# Patient Record
Sex: Male | Born: 1955 | Hispanic: No | Marital: Married | Smoking: Former smoker
Health system: Southern US, Community
[De-identification: ages and names within clinical notes are randomized; demographics above are authoritative.]

## PROBLEM LIST (undated history)

## (undated) DIAGNOSIS — S129XXA Fracture of neck, unspecified, initial encounter: Secondary | ICD-10-CM

## (undated) DIAGNOSIS — I10 Essential (primary) hypertension: Secondary | ICD-10-CM

## (undated) DIAGNOSIS — R42 Dizziness and giddiness: Secondary | ICD-10-CM

## (undated) HISTORY — PX: CERVICAL SPINE SURGERY: SHX589

---

## 2003-06-06 ENCOUNTER — Encounter: Admission: RE | Admit: 2003-06-06 | Discharge: 2003-06-06 | Payer: Self-pay | Admitting: Family Medicine

## 2003-06-21 ENCOUNTER — Encounter: Admission: RE | Admit: 2003-06-21 | Discharge: 2003-07-20 | Payer: Self-pay | Admitting: Family Medicine

## 2007-11-19 ENCOUNTER — Ambulatory Visit: Payer: Self-pay | Admitting: *Deleted

## 2007-11-19 DIAGNOSIS — R209 Unspecified disturbances of skin sensation: Secondary | ICD-10-CM | POA: Insufficient documentation

## 2007-11-19 DIAGNOSIS — F1910 Other psychoactive substance abuse, uncomplicated: Secondary | ICD-10-CM

## 2007-11-19 DIAGNOSIS — R002 Palpitations: Secondary | ICD-10-CM | POA: Insufficient documentation

## 2007-11-20 DIAGNOSIS — E7800 Pure hypercholesterolemia, unspecified: Secondary | ICD-10-CM

## 2007-12-09 ENCOUNTER — Encounter: Payer: Self-pay | Admitting: Gastroenterology

## 2007-12-11 ENCOUNTER — Ambulatory Visit: Payer: Self-pay | Admitting: Gastroenterology

## 2010-06-03 LAB — CONVERTED CEMR LAB
Albumin: 4.4 g/dL (ref 3.5–5.2)
BUN: 10 mg/dL (ref 6–23)
Basophils Relative: 0.3 % (ref 0.0–3.0)
Calcium: 9.6 mg/dL (ref 8.4–10.5)
Chloride: 100 meq/L (ref 96–112)
Creatinine, Ser: 1 mg/dL (ref 0.4–1.5)
Direct LDL: 124.7 mg/dL
Eosinophils Relative: 1.8 % (ref 0.0–5.0)
GFR calc non Af Amer: 84 mL/min
Glucose, Bld: 95 mg/dL (ref 70–99)
HCT: 46.6 % (ref 39.0–52.0)
MCV: 87.4 fL (ref 78.0–100.0)
Monocytes Absolute: 0.4 10*3/uL (ref 0.1–1.0)
Monocytes Relative: 6.3 % (ref 3.0–12.0)
Neutrophils Relative %: 61.1 % (ref 43.0–77.0)
PSA: 0.51 ng/mL (ref 0.10–4.00)
Platelets: 217 10*3/uL (ref 150–400)
Potassium: 3.9 meq/L (ref 3.5–5.1)
RBC: 5.33 M/uL (ref 4.22–5.81)
Total CHOL/HDL Ratio: 6.1
Triglycerides: 203 mg/dL (ref 0–149)
WBC: 6.6 10*3/uL (ref 4.5–10.5)

## 2011-09-06 ENCOUNTER — Emergency Department (INDEPENDENT_AMBULATORY_CARE_PROVIDER_SITE_OTHER): Payer: Managed Care, Other (non HMO)

## 2011-09-06 ENCOUNTER — Emergency Department (HOSPITAL_BASED_OUTPATIENT_CLINIC_OR_DEPARTMENT_OTHER)
Admission: EM | Admit: 2011-09-06 | Discharge: 2011-09-06 | Disposition: A | Discharge status: Home / Self Care | Payer: Managed Care, Other (non HMO) | Attending: Emergency Medicine | Admitting: Emergency Medicine

## 2011-09-06 ENCOUNTER — Encounter (HOSPITAL_BASED_OUTPATIENT_CLINIC_OR_DEPARTMENT_OTHER): Payer: Self-pay | Admitting: *Deleted

## 2011-09-06 DIAGNOSIS — R42 Dizziness and giddiness: Secondary | ICD-10-CM

## 2011-09-06 DIAGNOSIS — R51 Headache: Secondary | ICD-10-CM

## 2011-09-06 DIAGNOSIS — R110 Nausea: Secondary | ICD-10-CM

## 2011-09-06 HISTORY — DX: Fracture of neck, unspecified, initial encounter: S12.9XXA

## 2011-09-06 HISTORY — DX: Dizziness and giddiness: R42

## 2011-09-06 LAB — BASIC METABOLIC PANEL WITH GFR
BUN: 16 mg/dL (ref 6–23)
CO2: 27 meq/L (ref 19–32)
Chloride: 101 meq/L (ref 96–112)
Creatinine, Ser: 0.8 mg/dL (ref 0.50–1.35)
Potassium: 3.5 meq/L (ref 3.5–5.1)

## 2011-09-06 LAB — DIFFERENTIAL
Basophils Absolute: 0 10*3/uL (ref 0.0–0.1)
Basophils Relative: 0 % (ref 0–1)
Eosinophils Absolute: 0.2 10*3/uL (ref 0.0–0.7)
Eosinophils Relative: 2 % (ref 0–5)
Lymphocytes Relative: 28 % (ref 12–46)
Lymphs Abs: 2.4 K/uL (ref 0.7–4.0)
Monocytes Absolute: 0.7 K/uL (ref 0.1–1.0)
Monocytes Relative: 8 % (ref 3–12)
Neutro Abs: 5.4 K/uL (ref 1.7–7.7)
Neutrophils Relative %: 62 % (ref 43–77)

## 2011-09-06 LAB — CBC
HCT: 41.3 % (ref 39.0–52.0)
Hemoglobin: 14.8 g/dL (ref 13.0–17.0)
MCH: 30.3 pg (ref 26.0–34.0)
MCHC: 35.8 g/dL (ref 30.0–36.0)
MCV: 84.5 fL (ref 78.0–100.0)
Platelets: 215 10*3/uL (ref 150–400)
RBC: 4.89 MIL/uL (ref 4.22–5.81)
RDW: 12.8 % (ref 11.5–15.5)
WBC: 8.7 K/uL (ref 4.0–10.5)

## 2011-09-06 LAB — BASIC METABOLIC PANEL
Calcium: 9.9 mg/dL (ref 8.4–10.5)
GFR calc Af Amer: >90 mL/min (ref >90)
GFR calc non Af Amer: >90 mL/min (ref >90)
Glucose, Bld: 118 mg/dL — ABNORMAL HIGH (ref 70–99)
Sodium: 139 mEq/L (ref 135–145)

## 2011-09-06 MED ORDER — IOHEXOL 350 MG/ML SOLN
80.0000 mL | Freq: Once | INTRAVENOUS | Status: AC | PRN
  Administered 2011-09-06: 80 mL via INTRAVENOUS

## 2011-09-06 MED ORDER — DIPHENHYDRAMINE HCL 50 MG/ML IJ SOLN
12.5000 mg | Freq: Once | INTRAMUSCULAR | Status: AC
  Administered 2011-09-06: 12.5 mg via INTRAVENOUS
  Filled 2011-09-06: qty 1

## 2011-09-06 MED ORDER — METOCLOPRAMIDE HCL 5 MG/ML IJ SOLN
10.0000 mg | Freq: Once | INTRAMUSCULAR | Status: AC
  Administered 2011-09-06: 10 mg via INTRAVENOUS
  Filled 2011-09-06: qty 2

## 2011-09-06 MED ORDER — MECLIZINE HCL 25 MG PO TABS: 25.0000 mg | ORAL_TABLET | Freq: Three times a day (TID) | ORAL | Status: AC | PRN

## 2011-09-06 NOTE — ED Notes (Signed)
Pt was driving his car approx 30min ago, when he had a sudden onset of dizzines, so severe that he had to pull over to the side of the road. Pt then drove to [facility], checked his BP and it was 176/104. Pt also reports nausea and a headache. Pt denies CP/SOB.

## 2011-09-06 NOTE — Discharge Instructions (Signed)
Vertigo Vertigo means you feel like you or your surroundings are moving when they are not. Vertigo can be dangerous if it occurs when you are at work, driving, or performing difficult activities.  CAUSES  Vertigo occurs when there is a conflict of signals sent to your brain from the visual and sensory systems in your body. There are many different causes of vertigo, including:  Infections, especially in the inner ear.   A bad reaction to a drug or misuse of alcohol and medicines.   Withdrawal from drugs or alcohol.   Rapidly changing positions, such as lying down or rolling over in bed.   A migraine headache.   Decreased blood flow to the brain.   Increased pressure in the brain from a head injury, infection, tumor, or bleeding.  SYMPTOMS  You may feel as though the world is spinning around or you are falling to the ground. Because your balance is upset, vertigo can cause nausea and vomiting. You may have involuntary eye movements (nystagmus). DIAGNOSIS  Vertigo is usually diagnosed by physical exam. If the cause of your vertigo is unknown, your caregiver may perform imaging tests, such as an MRI scan (magnetic resonance imaging). TREATMENT  Most cases of vertigo resolve on their own, without treatment. Depending on the cause, your caregiver may prescribe certain medicines. If your vertigo is related to body position issues, your caregiver may recommend movements or procedures to correct the problem. In rare cases, if your vertigo is caused by certain inner ear problems, you may need surgery. HOME CARE INSTRUCTIONS   Follow your caregiver's instructions.   Avoid driving.   Avoid operating heavy machinery.   Avoid performing any tasks that would be dangerous to you or others during a vertigo episode.   Tell your caregiver if you notice that certain medicines seem to be causing your vertigo. Some of the medicines used to treat vertigo episodes can actually make them worse in some  people.  SEEK IMMEDIATE MEDICAL CARE IF:   Your medicines do not relieve your vertigo or are making it worse.   You develop problems with talking, walking, weakness, or using your arms, hands, or legs.   You develop severe headaches.   Your nausea or vomiting continues or gets worse.   You develop visual changes.   A family member notices behavioral changes.   Your condition gets worse.  MAKE SURE YOU:  Understand these instructions.   Will watch your condition.   Will get help right away if you are not doing well or get worse.  Document Released: 01/30/2005 Document Revised: 04/11/2011 Document Reviewed: 11/08/2010 [facility] Patient Information 2012 [place], [place].    Headache Headaches are caused by many different problems. Most commonly, headache is caused by muscle tension from an injury, fatigue, or emotional upset. Excessive muscle contractions in the scalp and neck result in a headache that often feels like a tight band around the head. Tension headaches often have areas of tenderness over the scalp and the back of the neck. These headaches may last for hours, days, or longer, and some may contribute to migraines in those who have migraine problems. Migraines usually cause a throbbing headache, which is made worse by activity. Sometimes only one side of the head hurts. Nausea, vomiting, eye pain, and avoidance of food are common with migraines. Visual symptoms such as light sensitivity, blind spots, or flashing lights may also occur. Loud noises may worsen migraine headaches. Many factors may cause migraine headaches:  Emotional stress,  lack of sleep, and menstrual periods.   Alcohol and some drugs (such as birth control pills).   Diet factors (fasting, caffeine, food preservatives, chocolate).   Environmental factors (weather changes, bright lights, odors, smoke).  Other causes of headaches include minor injuries to the head. Arthritis in the neck; problems with the  jaw, eyes, ears, or nose are also causes of headaches. Allergies, drugs, alcohol, and exposure to smoke can also cause moderate headaches. Rebound headaches can occur if someone uses pain medications for a long period of time and then stops. Less commonly, blood vessel problems in the neck and brain (including stroke) can cause various types of headache. Treatment of headaches includes medicines for pain and relaxation. Ice packs or heat applied to the back of the head and neck help some people. Massaging the shoulders, neck and scalp are often very useful. Relaxation techniques and stretching can help prevent these headaches. Avoid alcohol and cigarette smoking as these tend to make headaches worse. Please see your caregiver if your headache is not better in 2 days.  SEEK IMMEDIATE MEDICAL CARE IF:   You develop a high fever, chills, or repeated vomiting.   You faint or have difficulty with vision.   You develop unusual numbness or weakness of your arms or legs.   Relief of pain is inadequate with medication, or you develop severe pain.   You develop confusion, or neck stiffness.   You have a worsening of a headache or do not obtain relief.  Document Released: 04/22/2005 Document Revised: 04/11/2011 Document Reviewed: 10/16/2006 [facility] Patient Information 2012 [place], [place].

## 2011-09-06 NOTE — ED Provider Notes (Signed)
History     CSN: [id number]  Arrival date & time 09/06/11  2028   First MD Initiated Contact with Patient 09/06/11 2135      Chief Complaint  Patient presents with  . Dizziness    (Consider location/radiation/quality/duration/timing/severity/associated sxs/prior treatment) HPI Comments: Pt was in usual state of health, was driving home around 1:19 PM and he twisted his neck to "crack it" and had a very loud crack, denies any immediate pain, but did develop a diffuse HA and had a sensation of the car spinning on the road and felt dizzy.  He managed to pull off in a nearby parking lot of a pharmacy, was able to ambulate with loss of balance, staggering and went inside, took BP and it was 170/90.  Normally doesn't have HTN.  denies focal numbness or weakness or slurred speech or visual change.  He continues to have some HA.  He has had vertigo in the past, but somewhat different.  No recent cough or cold.  No recent head trauma.  He has prior h/o cervical neck fracture requiring surgery and wearing a Halo for 3 months.    The history is provided by the patient.    Past Medical History  Diagnosis Date  . Vertigo   . Cervical spine fracture     Past Surgical History  Procedure Date  . Cervical spine surgery     History reviewed. No pertinent family history.  History  Substance Use Topics  . Smoking status: Former Games developer  . Smokeless tobacco: Not on file  . Alcohol Use: Yes     occasionally      Review of Systems  Constitutional: Negative.   Eyes: Negative for visual disturbance.  Cardiovascular: Negative for chest pain.  Gastrointestinal: Negative for nausea and vomiting.  Neurological: Positive for dizziness and headaches. Negative for weakness, light-headedness and numbness.  All other systems reviewed and are negative.    Allergies  Review of patient's allergies indicates no known allergies.  Home Medications   Current Outpatient Rx  Name Route Sig Dispense  Refill  . MECLIZINE HCL 25 MG PO TABS Oral Take 1 tablet (25 mg total) by mouth 3 (three) times daily as needed for dizziness or nausea. 15 tablet 0    BP 131/84  Pulse 80  Temp(Src) 98.5 F (36.9 C) (Oral)  Resp 18  Ht 5\' 9"  (1.753 m)  Wt 194 lb (87.998 kg)  BMI 28.65 kg/m2  SpO2 99%  Physical Exam  Nursing note and vitals reviewed. Constitutional: He is oriented to person, place, and time. He appears well-developed and well-nourished.  HENT:  Head: Normocephalic and atraumatic.  Cardiovascular: Normal rate.   Pulmonary/Chest: Effort normal and breath sounds normal.  Abdominal: Soft.  Neurological: He is alert and oriented to person, place, and time. He has normal strength. No cranial nerve deficit or sensory deficit. Coordination normal. GCS eye subscore is 4. GCS verbal subscore is 5. GCS motor subscore is 6.       Normal finger to nose and normal heel shin testing bilaterally.  No nystagmus  Skin: Skin is warm and dry.    ED Course  Procedures (including critical care time)  Labs Reviewed  BASIC METABOLIC PANEL - Abnormal; Notable for the following:    Glucose, Bld 118 (*)    All other components within normal limits  CBC  DIFFERENTIAL   Ct Angio Neck W/cm &/or Wo/cm  09/06/2011  *RADIOLOGY REPORT*  Clinical Data:  56 year old male with sudden  onset of dizziness nausea and headache after popping neck.  Remote history of cervical spine fracture.  CT ANGIOGRAPHY NECK  Technique:  Multidetector CT imaging of the neck was performed using the standard protocol during bolus administration of intravenous contrast.  Multiplanar CT image reconstructions including MIPs were obtained to evaluate the vascular anatomy. Carotid stenosis measurements (when applicable) are obtained utilizing NASCET criteria, using the distal internal carotid diameter as the denominator.  Contrast: 80mL OMNIPAQUE IOHEXOL 350 MG/ML SOLN  Comparison:   None.  Findings:  Negative lung apices.  No superior  mediastinal lymphadenopathy.  Negative thyroid, larynx, pharynx, parapharyngeal spaces, retropharyngeal space, sublingual space, submandibular glands and parotid glands. Visualized orbit soft tissues are within normal limits.  Cervical lymph nodes are within normal limits, no lymphadenopathy.  Negative visualized brain parenchyma. Visualized paranasal sinuses and mastoids are clear.  The skull base is intact.  Normal cervical vertebral height and alignment. Bilateral posterior element alignment is within normal limits.  Cervicothoracic junction alignment is within normal limits.  No clear healed deformity in the cervical spine. No acute osseous abnormality identified.  Vascular Findings: Aortic arch not included on these images. Normal proximal right brachial cephalic artery, left common carotid and left subclavian arteries.  Normal right common carotid artery origin.  Normal right common carotid artery and right carotid bifurcation.  Mildly tortuous but otherwise negative cervical right ICA.  Negative visualized right ICA siphon and terminus; right ACA A1 segment is hypoplastic / absent.  Dominant right vertebral artery.  Normal right vertebral artery origin.  The right vertebral artery is within normal limits throughout the neck and the skull base.  The right vertebral primarily supplies the basilar.  Right PICA is not identified the right AICA is patent.  Negative visualized basilar artery and other posterior circulation.  Normal left common carotid artery.  Normal left carotid bifurcation.  Mildly tortuous but otherwise negative cervical left ICA.  Mildly ectatic but otherwise negative visualized left ICA siphon diminutive left posterior communicating artery identified within normal origin.  The right ACA supplied from the left.  The left ICA terminus and visualized other anterior circulation are within normal limits.  Nondominant left vertebral artery.  Left vertebral artery origin and proximal course are  within normal limits.  Left vertebral artery is patent throughout the neck and within normal limits.  At the skull base, the left vertebral artery functionally terminates in PICA.   Review of the MIP images confirms the above findings.  IMPRESSION: 1.  Dominant right vertebral artery.  No vertebral artery dissection or stenosis. 2.  Negative visualized carotid arteries aside from tortuosity. 3.  No acute findings in the neck.  Original Report Authenticated By: Harley Hallmark, M.D.     1. Vertigo   2. Headache      ECG at time 20:44 shows NSR, normal axis, normal intervals, no ST or T wave abn's.   RA sat is 99% and normal.   11:35 PM Pt feels improved, HA is improved, BP is improved.  Pt is reassured that CT is ok.  Will give antivert as prescription and pt instructed to follow up with PCP next week.   MDM  Due to onset of vertigo with possible neck trauma, some concern for dissection of carotid or vertebral artery.  I discussed with Dr. Margo Aye with radiology who recommended CTA of neck.  Pt given IV reglan and benadryl for HA.  Will continue to monitor.          Casimiro Needle  Gareth Morgan, MD 09/06/11 [phone]

## 2012-09-07 IMAGING — CT CT ANGIO NECK
2 of 7 series · 8 of 33 positions shown · IV contrast (omnipaque)
Comparison: None.

CLINICAL DATA: 56-year-old male with sudden onset of dizziness
nausea and headache after popping neck.  Remote history of cervical
spine fracture.

CT ANGIOGRAPHY NECK
TECHNIQUE: Multidetector CT imaging of the neck was performed
using the standard protocol during bolus administration of
intravenous contrast.  Multiplanar CT image reconstructions
including MIPs were obtained to evaluate the vascular anatomy.
Carotid stenosis measurements (when applicable) are obtained
utilizing NASCET criteria, using the distal internal carotid
diameter as the denominator.
Contrast: 80mL OMNIPAQUE IOHEXOL 350 MG/ML SOLN

[Series 5: angio 2.0 b25f · axial · 0.46mm/px · z∈[-140,-56]mm · 2 of 126 slices shown]
[im 42/126  soft-tissue]
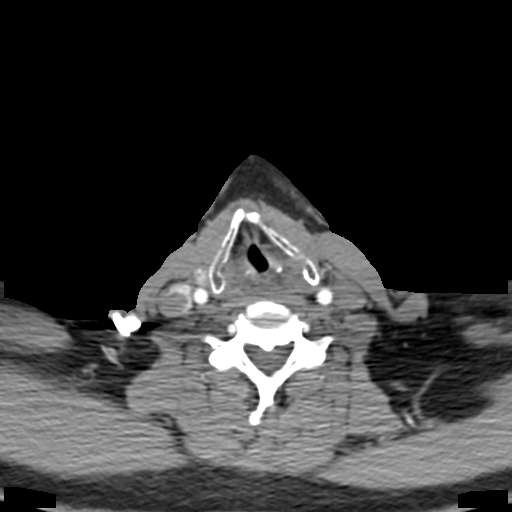
[im 84/126  soft-tissue]
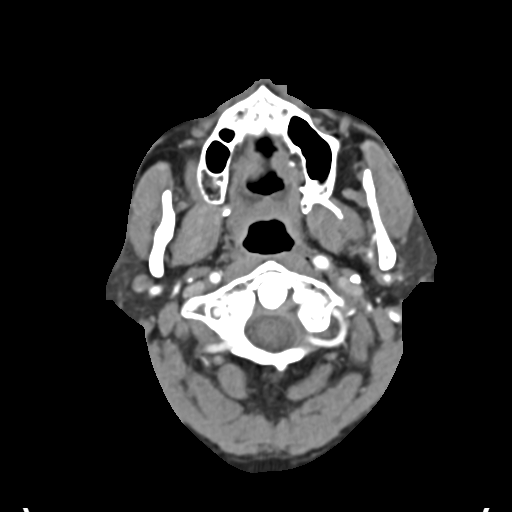

[Series 11: ax thin · axial · 0.43mm/px · z∈[-186,-10]mm · 6 of 248 slices shown]
[im 36/248  soft-tissue]
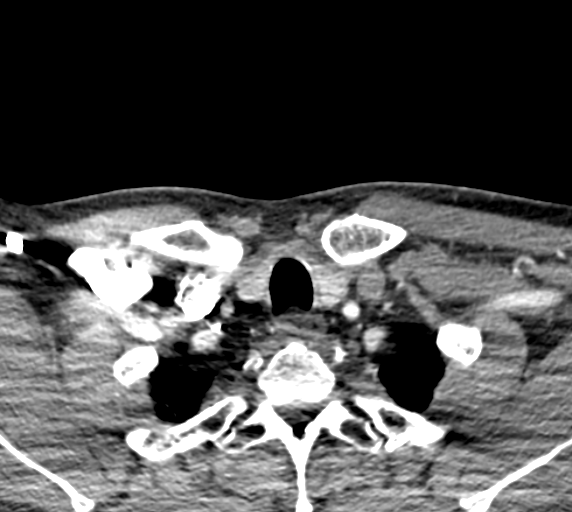
[im 71/248  bone]
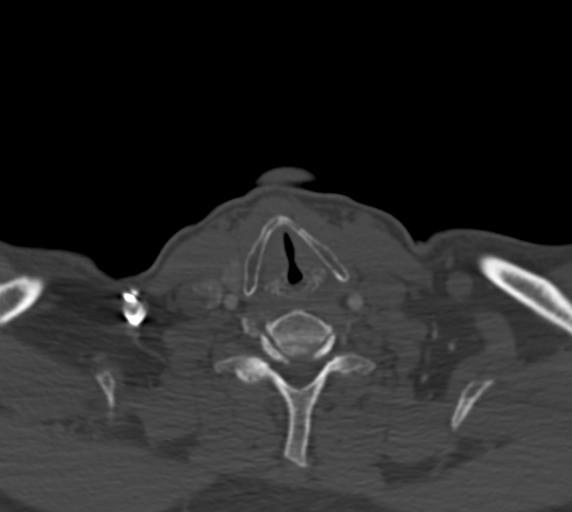
[im 106/248  soft-tissue]
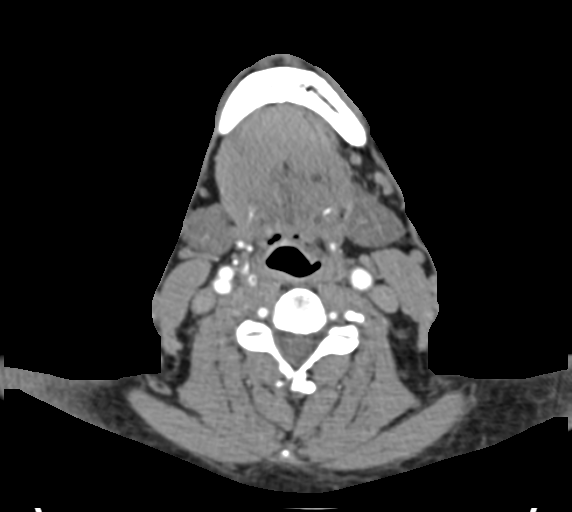
[im 142/248  bone]
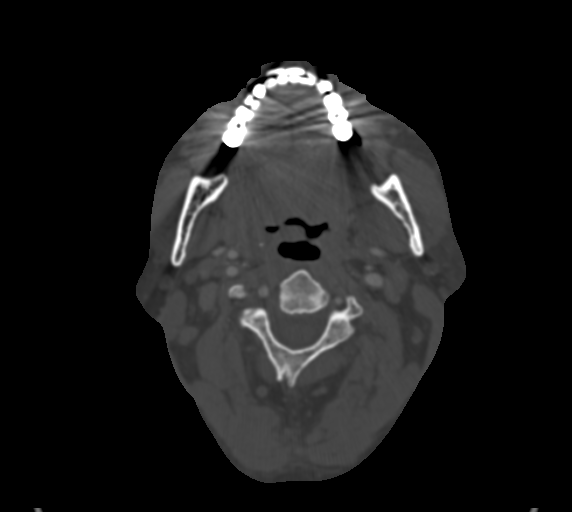
[im 177/248  soft-tissue]
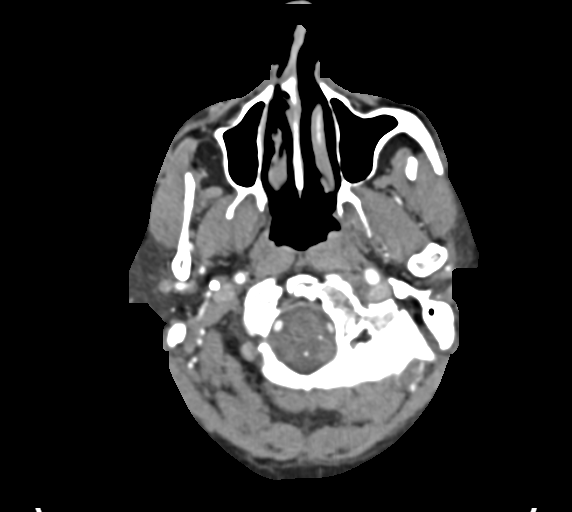
[im 212/248  bone]
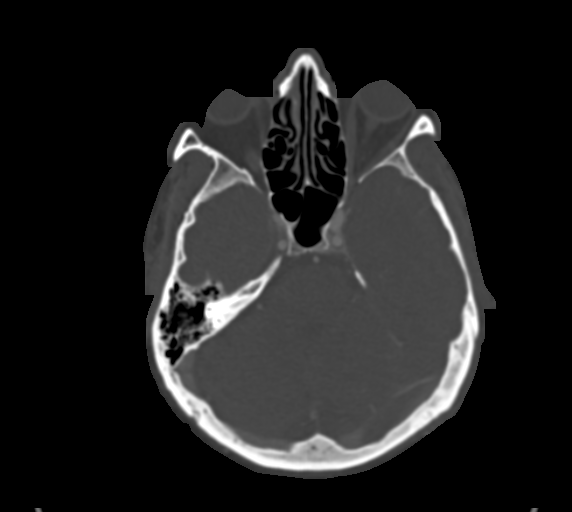

[8 of 33 positions shown; findings below may reference images not displayed]

FINDINGS: Negative lung apices.  No superior mediastinal
lymphadenopathy.  Negative thyroid, larynx, pharynx, parapharyngeal
spaces, retropharyngeal space, sublingual space, submandibular
glands and parotid glands. Visualized orbit soft tissues are within
normal limits.  Cervical lymph nodes are within normal limits, no
lymphadenopathy.

Negative visualized brain parenchyma. Visualized paranasal sinuses
and mastoids are clear.

The skull base is intact.  Normal cervical vertebral height and
alignment. Bilateral posterior element alignment is within normal
limits.  Cervicothoracic junction alignment is within normal
limits.  No clear healed deformity in the cervical spine. No acute
osseous abnormality identified.

Vascular Findings: Aortic arch not included on these images.
Normal proximal right brachial cephalic artery, left common carotid
and left subclavian arteries.

Normal right common carotid artery origin.  Normal right common
carotid artery and right carotid bifurcation.  Mildly tortuous but
otherwise negative cervical right ICA.  Negative visualized right
ICA siphon and terminus; right ACA A1 segment is hypoplastic /
absent.

Dominant right vertebral artery.  Normal right vertebral artery
origin.  The right vertebral artery is within normal limits
throughout the neck and the skull base.  The right vertebral
primarily supplies the basilar.  Right PICA is not identified the
right AICA is patent.  Negative visualized basilar artery and other
posterior circulation.

Normal left common carotid artery.  Normal left carotid
bifurcation.  Mildly tortuous but otherwise negative cervical left
ICA.  Mildly ectatic but otherwise negative visualized left ICA
siphon diminutive left posterior communicating artery identified
within normal origin.  The right ACA supplied from the left.  The
left ICA terminus and visualized other anterior circulation are
within normal limits.

Nondominant left vertebral artery.  Left vertebral artery origin
and proximal course are within normal limits.  Left vertebral
artery is patent throughout the neck and within normal limits.  At
the skull base, the left vertebral artery functionally terminates
in PICA.

 Review of the MIP images confirms the above findings.
IMPRESSION: 1.  Dominant right vertebral artery.  No vertebral artery
dissection or stenosis.
2.  Negative visualized carotid arteries aside from tortuosity.
3.  No acute findings in the neck.

## 2013-06-09 ENCOUNTER — Encounter (HOSPITAL_BASED_OUTPATIENT_CLINIC_OR_DEPARTMENT_OTHER): Payer: Self-pay | Admitting: Emergency Medicine

## 2013-06-09 ENCOUNTER — Emergency Department (HOSPITAL_BASED_OUTPATIENT_CLINIC_OR_DEPARTMENT_OTHER)
Admission: EM | Admit: 2013-06-09 | Discharge: 2013-06-09 | Disposition: A | Discharge status: Home / Self Care | Payer: PRIVATE HEALTH INSURANCE | Attending: Emergency Medicine | Admitting: Emergency Medicine

## 2013-06-09 DIAGNOSIS — R51 Headache: Secondary | ICD-10-CM | POA: Insufficient documentation

## 2013-06-09 DIAGNOSIS — I10 Essential (primary) hypertension: Secondary | ICD-10-CM | POA: Insufficient documentation

## 2013-06-09 DIAGNOSIS — Z8781 Personal history of (healed) traumatic fracture: Secondary | ICD-10-CM | POA: Insufficient documentation

## 2013-06-09 DIAGNOSIS — R519 Headache, unspecified: Secondary | ICD-10-CM

## 2013-06-09 LAB — BASIC METABOLIC PANEL
BUN: 16 mg/dL (ref 6–23)
CALCIUM: 9 mg/dL (ref 8.4–10.5)
CHLORIDE: 101 meq/L (ref 96–112)
CO2: 23 meq/L (ref 19–32)
CREATININE: 1 mg/dL (ref 0.50–1.35)
GFR calc Af Amer: >90 mL/min (ref >90)
GFR calc non Af Amer: 82 mL/min — ABNORMAL LOW (ref >90)
GLUCOSE: 101 mg/dL — AB (ref 70–99)
Potassium: 3.9 mEq/L (ref 3.7–5.3)
Sodium: 138 mEq/L (ref 137–147)

## 2013-06-09 MED ORDER — ONDANSETRON 4 MG PO TBDP
4.0000 mg | ORAL_TABLET | Freq: Once | ORAL | Status: AC
  Administered 2013-06-09: 4 mg via ORAL
  Filled 2013-06-09: qty 1

## 2013-06-09 MED ORDER — ACETAMINOPHEN 325 MG PO TABS
650.0000 mg | ORAL_TABLET | Freq: Once | ORAL | Status: AC
  Administered 2013-06-09: 650 mg via ORAL
  Filled 2013-06-09: qty 2

## 2013-06-09 NOTE — ED Notes (Signed)
C/o HA started approx 2 hours ago, dizziness-checked BP-was elevated

## 2013-06-09 NOTE — ED Provider Notes (Signed)
CSN: [id number]     Arrival date & time 06/09/13  1851 History  This chart was scribed [id number] Tracy B. Bernette MayersSheldon, MD by Danella Maiersaroline Early, ED Scribe. This patient was seen in room MH07/MH07 and the patient's care was started at 7:30 PM.    Chief Complaint  Patient presents with  . Headache   The history is provided by the patient. No language interpreter was used.   HPI Comments: Tracy Valencia is a 58 y.o. male who presents to the Emergency Department complaining of headache onset 5pm today with nausea and dizziness while shopping at the grocery store. He states the headache came first. He states [facility] and it was 155/107 He is not on blood pressure medication. He has a PCP but has never been told he has high BP. He has been [place] trip to [place], during which he had the flu. He got back yesterday. He denies CP, blurred or double vision, SOB, vomiting.   Past Medical History  Diagnosis Date  . Vertigo   . Cervical spine fracture    Past Surgical History  Procedure Laterality Date  . Cervical spine surgery     No family history on file. History  Substance Use Topics  . Smoking status: Former Games developermoker  . Smokeless tobacco: Not on file  . Alcohol Use: Yes     Comment: occasionally    Review of Systems  Eyes: Negative for visual disturbance.  Respiratory: Negative for shortness of breath.   Cardiovascular: Negative for chest pain.  Gastrointestinal: Positive for nausea. Negative for vomiting.  Neurological: Positive for dizziness and headaches.   A complete 10 system review of systems was obtained and all systems are negative except as noted in the HPI and PMH.   Allergies  Review of patient's allergies indicates no known allergies.  Home Medications  No current outpatient prescriptions on file. BP 160/101  Pulse 87  Temp(Src) 97.9 F (36.6 C) (Oral)  Resp 18  Ht 5\' 8"  (1.727 m)  Wt 205 lb (92.987 kg)  BMI 31.18 kg/m2  SpO2  100% Physical Exam  Nursing note and vitals reviewed. Constitutional: He is oriented to person, place, and time. He appears well-developed and well-nourished.  HENT:  Head: Normocephalic and atraumatic.  Eyes: EOM are normal. Pupils are equal, round, and reactive to light.  Neck: Normal range of motion. Neck supple.  Cardiovascular: Normal rate, normal heart sounds and intact distal pulses.   Pulmonary/Chest: Effort normal and breath sounds normal.  Abdominal: Bowel sounds are normal. He exhibits no distension. There is no tenderness.  Musculoskeletal: Normal range of motion. He exhibits no edema and no tenderness.  Neurological: He is alert and oriented to person, place, and time. He has normal strength. No cranial nerve deficit or sensory deficit.  Skin: Skin is warm and dry. No rash noted.  Psychiatric: He has a normal mood and affect.    ED Course  Procedures (including critical care time) Medications  acetaminophen (TYLENOL) tablet 650 mg (650 mg Oral Given 06/09/13 1959)  ondansetron (ZOFRAN-ODT) disintegrating tablet 4 mg (4 mg Oral Given 06/09/13 2000)    DIAGNOSTIC STUDIES: Oxygen Saturation is 100% on RA, normal by my interpretation.    COORDINATION OF CARE: 7:42 PM- Discussed treatment plan with pt which includes Zofran, Tylenol, blood work, and EKG. Pt agrees to plan.   Labs Review Labs Reviewed  BASIC METABOLIC PANEL - Abnormal; Notable for the following:  Glucose, Bld 101 (*)    GFR calc non Af Amer 82 (*)    All other components within normal limits   Imaging Review No results found.  EKG Interpretation    Date/Time:  Wednesday June 09 2013 20:10:18 EST Ventricular Rate:  78 PR Interval:  148 QRS Duration: 98 QT Interval:  402 QTC Calculation: 458 R Axis:   70 Text Interpretation:  Normal sinus rhythm Normal ECG No significant change since last tracing Confirmed by [name]  MD, Tracy ([id number]) on 06/09/2013 8:46:00 PM            MDM   1.  Headache   2. Hypertension     HTN with headache and nausea but no end organ damage. Symptoms and BP improved in the ED. Advised to log BP at home and followup with PCP.   I personally performed the services described in this documentation, which was scribed in my presence. The recorded information has been reviewed and is accurate.      Tracy B. Bernette Mayers, MD 06/09/13 2151

## 2013-06-09 NOTE — Discharge Instructions (Signed)

## 2013-06-09 NOTE — ED Notes (Signed)
MD at bedside. 

## 2019-08-09 ENCOUNTER — Ambulatory Visit: Payer: Self-pay | Attending: Internal Medicine

## 2022-06-08 ENCOUNTER — Encounter (HOSPITAL_BASED_OUTPATIENT_CLINIC_OR_DEPARTMENT_OTHER): Payer: Self-pay | Admitting: Emergency Medicine

## 2022-06-08 ENCOUNTER — Emergency Department (HOSPITAL_BASED_OUTPATIENT_CLINIC_OR_DEPARTMENT_OTHER)
Admission: EM | Admit: 2022-06-08 | Discharge: 2022-06-08 | Disposition: A | Discharge status: Home / Self Care | Payer: 59 | Attending: Emergency Medicine | Admitting: Emergency Medicine

## 2022-06-08 ENCOUNTER — Emergency Department (HOSPITAL_BASED_OUTPATIENT_CLINIC_OR_DEPARTMENT_OTHER): Payer: 59

## 2022-06-08 DIAGNOSIS — E876 Hypokalemia: Secondary | ICD-10-CM | POA: Insufficient documentation

## 2022-06-08 DIAGNOSIS — R519 Headache, unspecified: Secondary | ICD-10-CM | POA: Diagnosis present

## 2022-06-08 DIAGNOSIS — I10 Essential (primary) hypertension: Secondary | ICD-10-CM | POA: Insufficient documentation

## 2022-06-08 HISTORY — DX: Essential (primary) hypertension: I10

## 2022-06-08 LAB — CBC WITH DIFFERENTIAL/PLATELET
Abs Immature Granulocytes: 0.03 10*3/uL (ref 0.00–0.07)
Basophils Absolute: 0 10*3/uL (ref 0.0–0.1)
Basophils Relative: 0 %
Eosinophils Absolute: 0.1 10*3/uL (ref 0.0–0.5)
Eosinophils Relative: 1 %
HCT: 42.7 % (ref 39.0–52.0)
Hemoglobin: 14.8 g/dL (ref 13.0–17.0)
Immature Granulocytes: 0 %
Lymphocytes Relative: 23 %
Lymphs Abs: 2.2 10*3/uL (ref 0.7–4.0)
MCH: 30.3 pg (ref 26.0–34.0)
MCHC: 34.7 g/dL (ref 30.0–36.0)
MCV: 87.5 fL (ref 80.0–100.0)
Monocytes Absolute: 0.6 10*3/uL (ref 0.1–1.0)
Monocytes Relative: 7 %
Neutro Abs: 6.4 10*3/uL (ref 1.7–7.7)
Neutrophils Relative %: 69 %
Platelets: 210 10*3/uL (ref 150–400)
RBC: 4.88 MIL/uL (ref 4.22–5.81)
RDW: 11.9 % (ref 11.5–15.5)
WBC: 9.4 10*3/uL (ref 4.0–10.5)
nRBC: 0 % (ref 0.0–0.2)

## 2022-06-08 LAB — BASIC METABOLIC PANEL
Anion gap: 11 (ref 5–15)
BUN: 16 mg/dL (ref 8–23)
CO2: 25 mmol/L (ref 22–32)
Calcium: 8.9 mg/dL (ref 8.9–10.3)
Chloride: 98 mmol/L (ref 98–111)
Creatinine, Ser: 0.97 mg/dL (ref 0.61–1.24)
GFR, Estimated: >60 mL/min (ref >60)
Glucose, Bld: 78 mg/dL (ref 70–99)
Potassium: 3.5 mmol/L (ref 3.5–5.1)
Sodium: 134 mmol/L — ABNORMAL LOW (ref 135–145)

## 2022-06-08 MED ORDER — ACETAMINOPHEN 500 MG PO TABS
1000.0000 mg | ORAL_TABLET | Freq: Once | ORAL | Status: AC
  Administered 2022-06-08: 1000 mg via ORAL
  Filled 2022-06-08: qty 2

## 2022-06-08 NOTE — ED Provider Notes (Addendum)
[facility] Provider Note   CSN: [id number] Arrival date & time: 06/08/22  1753     History  Chief Complaint  Patient presents with   Hypertension    Tracy Valencia is a 67 y.o. male.  Patient here with the complaint of headache kind of all over started yesterday noted his blood pressures have been elevated the highest he got at home was like systolic of the 585I.  Patient is on blood pressure medicine lisinopril hydrochlorothiazide followed by [facility].  He has been taking his blood pressure medicine.  Denies any other symptoms.  No upper respiratory symptoms.  No weakness no numbness no visual changes no speech problems.  Patient states he does get headaches at times.  But this is the first time he is can notice his blood pressure being elevated with it.  And was concerned.  Past medical history significant for hypertension.         Home Medications Prior to Admission medications   Not on File      Allergies    Patient has no known allergies.    Review of Systems   Review of Systems  Constitutional:  Negative for chills and fever.  HENT:  Negative for ear pain and sore throat.   Eyes:  Negative for pain and visual disturbance.  Respiratory:  Negative for cough and shortness of breath.   Cardiovascular:  Negative for chest pain and palpitations.  Gastrointestinal:  Negative for abdominal pain and vomiting.  Genitourinary:  Negative for dysuria and hematuria.  Musculoskeletal:  Negative for arthralgias and back pain.  Skin:  Negative for color change and rash.  Neurological:  Positive for headaches. Negative for dizziness, seizures, syncope, facial asymmetry, speech difficulty, weakness and numbness.  All other systems reviewed and are negative.   Physical Exam Updated Vital Signs BP (!) 145/99 (BP Location: Right Arm)   Pulse 87   Temp 98.7 F (37.1 C) (Oral)   Resp 17   Ht 1.702 m (5\' 7" )   Wt 81.6  kg   SpO2 100%   BMI 28.19 kg/m  Physical Exam Vitals and nursing note reviewed.  Constitutional:      General: He is not in acute distress.    Appearance: Normal appearance. He is well-developed.  HENT:     Head: Normocephalic and atraumatic.  Eyes:     Conjunctiva/sclera: Conjunctivae normal.     Pupils: Pupils are equal, round, and reactive to light.  Cardiovascular:     Rate and Rhythm: Normal rate and regular rhythm.     Heart sounds: No murmur heard. Pulmonary:     Effort: Pulmonary effort is normal. No respiratory distress.     Breath sounds: Normal breath sounds.  Abdominal:     Palpations: Abdomen is soft.     Tenderness: There is no abdominal tenderness.  Musculoskeletal:        General: No swelling.     Cervical back: Normal range of motion and neck supple. No rigidity.  Skin:    General: Skin is warm and dry.     Capillary Refill: Capillary refill takes less than 2 seconds.  Neurological:     General: No focal deficit present.     Mental Status: He is alert and oriented to person, place, and time.     Cranial Nerves: No cranial nerve deficit.     Sensory: No sensory deficit.     Motor: No weakness.  Psychiatric:  Mood and Affect: Mood normal.     ED Results / Procedures / Treatments   Labs (all labs ordered are listed, but only abnormal results are displayed) Labs Reviewed  CBC WITH DIFFERENTIAL/PLATELET  BASIC METABOLIC PANEL    EKG None  Radiology No results found.  Procedures Procedures    Medications Ordered in ED Medications - No data to display  ED Course/ Medical Decision Making/ A&P                             Medical Decision Making Amount and/or Complexity of Data Reviewed Labs: ordered. Radiology: ordered.  Risk OTC drugs.    Patient without any focal neurodeficit.  Most likely blood pressure is elevated due to the headache.  Will get head CT basic labs.  Patient drove himself here.  Will give him extra trying  Tylenol.  Patient feeling better.  Blood pressures improved.  Systolic now 979.  CBC without any acute findings.  Hemoglobin normal.  Basic metabolic panel sodium a little low at 134 potassium good at 3.5 renal function normal.  CT scan head without any acute findings.  Patient stable for discharge home.  Headaches persist MRI would be recommended.  Patient has primary care doctor to follow-up with.  Will also recommend to keep a blood pressure log and check back with his primary care doctor to make sure that his blood pressure is well-controlled.  Showing signs of improvement here.  Patient will return for any new or worse symptoms.  Final Clinical Impression(s) / ED Diagnoses Final diagnoses:  Primary hypertension  Acute intractable headache, unspecified headache type    Rx / DC Orders ED Discharge Orders     None         Fredia Sorrow, MD 06/08/22 [name]    Fredia Sorrow, MD 06/08/22 2104

## 2022-06-08 NOTE — Discharge Instructions (Signed)
Make an appointment follow-up with your primary care doctor.  Keep a blood pressure log.  Have them checked out to see if there needs to be any adjustments in your blood pressure medicine.  Also if headache persist MRI would be recommended.  Also return for any new or worse symptoms to include anything that would be strokelike.  CT head here today without any acute findings.  And labs without any significant abnormalities.

## 2022-06-08 NOTE — ED Triage Notes (Signed)
Pt w/ HA since yesterday; reports BP at home was 140s and he is normally 120s

## 2023-10-10 ENCOUNTER — Ambulatory Visit: Payer: 59 | Admitting: Family Medicine
